# Patient Record
Sex: Male | Born: 1985 | Race: Black or African American | Hispanic: No | Marital: Single | Smoking: Former smoker
Health system: Southern US, Community
[De-identification: ages and names within clinical notes are randomized; demographics above are authoritative.]

## PROBLEM LIST (undated history)

## (undated) DIAGNOSIS — I10 Essential (primary) hypertension: Secondary | ICD-10-CM

---

## 2012-07-02 ENCOUNTER — Emergency Department (HOSPITAL_BASED_OUTPATIENT_CLINIC_OR_DEPARTMENT_OTHER): Payer: Self-pay

## 2012-07-02 ENCOUNTER — Encounter (HOSPITAL_BASED_OUTPATIENT_CLINIC_OR_DEPARTMENT_OTHER): Payer: Self-pay | Admitting: *Deleted

## 2012-07-02 ENCOUNTER — Emergency Department (HOSPITAL_BASED_OUTPATIENT_CLINIC_OR_DEPARTMENT_OTHER)
Admission: EM | Admit: 2012-07-02 | Discharge: 2012-07-02 | Disposition: A | Discharge status: Home / Self Care | Payer: Self-pay | Attending: Emergency Medicine | Admitting: Emergency Medicine

## 2012-07-02 DIAGNOSIS — R1110 Vomiting, unspecified: Secondary | ICD-10-CM | POA: Insufficient documentation

## 2012-07-02 DIAGNOSIS — J40 Bronchitis, not specified as acute or chronic: Secondary | ICD-10-CM | POA: Insufficient documentation

## 2012-07-02 DIAGNOSIS — R05 Cough: Secondary | ICD-10-CM | POA: Insufficient documentation

## 2012-07-02 DIAGNOSIS — K226 Gastro-esophageal laceration-hemorrhage syndrome: Secondary | ICD-10-CM | POA: Insufficient documentation

## 2012-07-02 DIAGNOSIS — Z79899 Other long term (current) drug therapy: Secondary | ICD-10-CM | POA: Insufficient documentation

## 2012-07-02 DIAGNOSIS — J029 Acute pharyngitis, unspecified: Secondary | ICD-10-CM | POA: Insufficient documentation

## 2012-07-02 DIAGNOSIS — R059 Cough, unspecified: Secondary | ICD-10-CM | POA: Insufficient documentation

## 2012-07-02 DIAGNOSIS — I10 Essential (primary) hypertension: Secondary | ICD-10-CM | POA: Insufficient documentation

## 2012-07-02 DIAGNOSIS — F17200 Nicotine dependence, unspecified, uncomplicated: Secondary | ICD-10-CM | POA: Insufficient documentation

## 2012-07-02 HISTORY — DX: Essential (primary) hypertension: I10

## 2012-07-02 LAB — CBC
HCT: 52.2 % — ABNORMAL HIGH (ref 39.0–52.0)
Hemoglobin: 18.1 g/dL — ABNORMAL HIGH (ref 13.0–17.0)
RDW: 14.3 % (ref 11.5–15.5)
WBC: 6.1 10*3/uL (ref 4.0–10.5)

## 2012-07-02 MED ORDER — AZITHROMYCIN 250 MG PO TABS: 250.0000 mg | ORAL_TABLET | Freq: Every day | ORAL | Status: AC

## 2012-07-02 MED ORDER — RANITIDINE HCL 150 MG PO TABS: 150.0000 mg | ORAL_TABLET | Freq: Two times a day (BID) | ORAL | Status: AC

## 2012-07-02 MED ORDER — GUAIFENESIN-CODEINE 100-10 MG/5ML PO SYRP: 5.0000 mL | ORAL_SOLUTION | Freq: Three times a day (TID) | ORAL | Status: AC | PRN

## 2012-07-02 NOTE — ED Provider Notes (Signed)
History     CSN: [id number]  Arrival date & time 07/02/12  1458   None     Chief Complaint  Patient presents with  . Hemoptysis    (Consider location/radiation/quality/duration/timing/severity/associated sxs/prior treatment) HPI Perry Morse is a 27 y.o. male who presents to the ED with cough. The cough is nonproductive. He states that he has coughed until he vomits. The vomit the past 2 days has had streaks of blood and his throat feels irritated from coughing so much. He denies fever or any other problems. The history was provided by the patient.  Past Medical History  Diagnosis Date  . Hypertension     History reviewed. No pertinent past surgical history.  History reviewed. No pertinent family history.  History  Substance Use Topics  . Smoking status: Current Every Day Smoker  . Smokeless tobacco: Not on file  . Alcohol Use: Yes      Review of Systems  Constitutional: Negative for fever and activity change.  HENT: Positive for sore throat. Negative for ear pain, trouble swallowing, neck pain and sinus pressure.   Eyes: Negative for redness.  Respiratory: Positive for cough. Negative for shortness of breath.   Gastrointestinal: Positive for vomiting (with cough). Negative for nausea and abdominal pain.  Musculoskeletal: Negative for back pain.  Skin: Negative for rash.  Neurological: Negative for headaches.  Psychiatric/Behavioral: The patient is not nervous/anxious.     Allergies  Review of patient's allergies indicates no known allergies.  Home Medications   Current Outpatient Rx  Name  Route  Sig  Dispense  Refill  . hydrochlorothiazide (HYDRODIURIL) 25 MG tablet   Oral   Take 25 mg by mouth daily.           BP 131/79  Pulse 96  Temp(Src) 98.6 F (37 C) (Oral)  Resp 20  Ht 6\' 1"  (1.854 m)  Wt 237 lb (107.502 kg)  BMI 31.27 kg/m2  SpO2 98%  Physical Exam  Nursing note and vitals reviewed. Constitutional: He is oriented to person, place,  and time. He appears well-developed and well-nourished. No distress.  HENT:  Head: Normocephalic.  Eyes: Conjunctivae and EOM are normal.  Neck: Neck supple.  Cardiovascular: Normal rate, regular rhythm and normal heart sounds.   Pulmonary/Chest: Effort normal. No respiratory distress. Wheezes: occasional. He has no rales. He exhibits tenderness.  Abdominal: Soft. There is no tenderness.  Musculoskeletal: Normal range of motion.  Neurological: He is alert and oriented to person, place, and time. No cranial nerve deficit.  Skin: Skin is warm and dry.  Psychiatric: He has a normal mood and affect. His behavior is normal.    Dg Chest 2 View  07/02/2012   *RADIOLOGY REPORT*  Clinical Data: Hemoptysis  CHEST - 2 VIEW  Comparison: None  Findings: The heart size and mediastinal contours are within normal limits.  Both lungs are clear.  The visualized skeletal structures are unremarkable.  IMPRESSION: Negative exam   Original Report Authenticated By: Signa Kell, M.D.    ED Course  Procedures (including critical care time) Results for orders placed during the hospital encounter of 07/02/12 (from the past 24 hour(s))  CBC     Status: Abnormal   Collection Time    07/02/12  5:10 PM      Result Value Range   WBC 6.1  4.0 - 10.5 K/uL   RBC 5.99 (*) 4.22 - 5.81 MIL/uL   Hemoglobin 18.1 (*) 13.0 - 17.0 g/dL   HCT 56.2 (*) 13.0 -  52.0 %   MCV 87.1  78.0 - 100.0 fL   MCH 30.2  26.0 - 34.0 pg   MCHC 34.7  30.0 - 36.0 g/dL   RDW 08.6  57.8 - 46.9 %   Platelets 197  150 - 400 K/uL     MDM  27 y.o. male with cough and vomiting due to the cough with streaks of blood. Will treat for bronchitis and Mallory Weiss tear. Patient stable for discharge to follow up with his PCP. No concern for GI bleed at this time. I have reviewed this patient's vital signs, nurses notes, appropriate labs and imaging.  I have discussed clinical, lab and x-ray findings with the patient and plan of care. Patient voices  understanding.    Medication List    TAKE these medications       azithromycin 250 MG tablet  Commonly known as:  ZITHROMAX  Take 1 tablet (250 mg total) by mouth daily. Take first 2 tablets together, then 1 every day until finished.     guaiFENesin-codeine 100-10 MG/5ML syrup  Commonly known as:  ROBITUSSIN AC  Take 5 mLs by mouth 3 (three) times daily as needed for cough.     ranitidine 150 MG tablet  Commonly known as:  ZANTAC  Take 1 tablet (150 mg total) by mouth 2 (two) times daily.      ASK your doctor about these medications       hydrochlorothiazide 25 MG tablet  Commonly known as:  HYDRODIURIL  Take 25 mg by mouth daily.               [facility] Orlene Och, [place] 07/03/12 0030

## 2012-07-02 NOTE — ED Notes (Addendum)
Pt states he has had a bad cough and some burning in the esophagus, then "throws up blood" onset yesterday. Also c/o night sweats. Got out of prison in Feb

## 2012-07-03 NOTE — ED Provider Notes (Signed)
Medical screening examination/treatment/procedure(s) were performed by non-physician practitioner and as supervising physician I was immediately available for consultation/collaboration.   Ayeshia Coppin III, MD 07/03/12 1319 

## 2017-11-16 ENCOUNTER — Emergency Department (HOSPITAL_BASED_OUTPATIENT_CLINIC_OR_DEPARTMENT_OTHER): Payer: Self-pay

## 2017-11-16 ENCOUNTER — Other Ambulatory Visit: Payer: Self-pay

## 2017-11-16 ENCOUNTER — Emergency Department (HOSPITAL_BASED_OUTPATIENT_CLINIC_OR_DEPARTMENT_OTHER)
Admission: EM | Admit: 2017-11-16 | Discharge: 2017-11-16 | Disposition: A | Discharge status: Home / Self Care | Payer: Self-pay | Attending: Emergency Medicine | Admitting: Emergency Medicine

## 2017-11-16 ENCOUNTER — Encounter (HOSPITAL_BASED_OUTPATIENT_CLINIC_OR_DEPARTMENT_OTHER): Payer: Self-pay | Admitting: *Deleted

## 2017-11-16 DIAGNOSIS — Y998 Other external cause status: Secondary | ICD-10-CM | POA: Insufficient documentation

## 2017-11-16 DIAGNOSIS — I10 Essential (primary) hypertension: Secondary | ICD-10-CM | POA: Insufficient documentation

## 2017-11-16 DIAGNOSIS — W228XXA Striking against or struck by other objects, initial encounter: Secondary | ICD-10-CM | POA: Insufficient documentation

## 2017-11-16 DIAGNOSIS — F17210 Nicotine dependence, cigarettes, uncomplicated: Secondary | ICD-10-CM | POA: Insufficient documentation

## 2017-11-16 DIAGNOSIS — Z79899 Other long term (current) drug therapy: Secondary | ICD-10-CM | POA: Insufficient documentation

## 2017-11-16 DIAGNOSIS — Y9289 Other specified places as the place of occurrence of the external cause: Secondary | ICD-10-CM | POA: Insufficient documentation

## 2017-11-16 DIAGNOSIS — S52612A Displaced fracture of left ulna styloid process, initial encounter for closed fracture: Secondary | ICD-10-CM | POA: Insufficient documentation

## 2017-11-16 DIAGNOSIS — Y9389 Activity, other specified: Secondary | ICD-10-CM | POA: Insufficient documentation

## 2017-11-16 MED ORDER — IBUPROFEN 600 MG PO TABS: 600.0000 mg | ORAL_TABLET | Freq: Four times a day (QID) | ORAL | 0 refills | Status: DC | PRN

## 2017-11-16 MED ORDER — HYDROCODONE-ACETAMINOPHEN 5-325 MG PO TABS
2.0000 | ORAL_TABLET | Freq: Once | ORAL | Status: AC
  Administered 2017-11-16: 2 via ORAL
  Filled 2017-11-16: qty 2

## 2017-11-16 MED ORDER — HYDROCODONE-ACETAMINOPHEN 5-325 MG PO TABS
1.0000 | ORAL_TABLET | Freq: Four times a day (QID) | ORAL | 0 refills | Status: AC | PRN
Start: 2017-11-16 — End: ?
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
## 2017-11-16 NOTE — ED Notes (Signed)
Pt teaching provided on medications that may cause drowsiness. Pt instructed not to drive or operate heavy machinery while taking the prescribed medication. Pt verbalized understanding.   

## 2017-11-16 NOTE — Discharge Instructions (Signed)
Take ibuprofen every 6 hours for your pain.  For breakthrough pain, you can take 1-2 Norco every 6 hours.  Use ice 3-4 times daily alternating 20 minutes on, 20 minutes off.  Please follow-up with Dr. Amanda Pea, the hand doctor, for further management of your ulnar styloid fracture.  Keep your splint on until you are evaluated by the hand doctor.  Do not get the splint wet.  Please return if you get the splint wet, develop numbness or discoloration in your fingers, or any other new or concerning symptoms.  Do not drink alcohol, drive, operate machinery or participate in any other potentially dangerous activities while taking opiate pain medication as it may make you sleepy. Do not take this medication with any other sedating medications, either prescription or over-the-counter. If you were prescribed Percocet or Vicodin, do not take these with acetaminophen (Tylenol) as it is already contained within these medications and overdose of Tylenol is dangerous.   This medication is an opiate (or narcotic) pain medication and can be habit forming.  Use it as little as possible to achieve adequate pain control.  Do not use or use it with extreme caution if you have a history of opiate abuse or dependence. This medication is intended for your use only - do not give any to anyone else and keep it in a secure place where nobody else, especially children, have access to it. It will also cause or worsen constipation, so you may want to consider taking an over-the-counter stool softener while you are taking this medication.
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
## 2017-11-18 ENCOUNTER — Other Ambulatory Visit: Payer: Self-pay

## 2017-11-18 ENCOUNTER — Emergency Department (HOSPITAL_BASED_OUTPATIENT_CLINIC_OR_DEPARTMENT_OTHER)
Admission: EM | Admit: 2017-11-18 | Discharge: 2017-11-18 | Disposition: A | Discharge status: Home / Self Care | Payer: Self-pay | Attending: Emergency Medicine | Admitting: Emergency Medicine

## 2017-11-18 ENCOUNTER — Encounter (HOSPITAL_BASED_OUTPATIENT_CLINIC_OR_DEPARTMENT_OTHER): Payer: Self-pay | Admitting: Emergency Medicine

## 2017-11-18 DIAGNOSIS — S62102D Fracture of unspecified carpal bone, left wrist, subsequent encounter for fracture with routine healing: Secondary | ICD-10-CM

## 2017-11-18 DIAGNOSIS — S6292XD Unspecified fracture of left wrist and hand, subsequent encounter for fracture with routine healing: Secondary | ICD-10-CM | POA: Insufficient documentation

## 2017-11-18 DIAGNOSIS — X58XXXD Exposure to other specified factors, subsequent encounter: Secondary | ICD-10-CM | POA: Insufficient documentation

## 2017-11-18 DIAGNOSIS — Z79899 Other long term (current) drug therapy: Secondary | ICD-10-CM | POA: Insufficient documentation

## 2017-11-18 DIAGNOSIS — I10 Essential (primary) hypertension: Secondary | ICD-10-CM | POA: Insufficient documentation

## 2017-11-18 DIAGNOSIS — F17200 Nicotine dependence, unspecified, uncomplicated: Secondary | ICD-10-CM | POA: Insufficient documentation

## 2017-11-18 MED ORDER — NAPROXEN 500 MG PO TABS: 500.0000 mg | ORAL_TABLET | Freq: Two times a day (BID) | ORAL | 0 refills | Status: AC
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
## 2017-11-18 NOTE — Discharge Instructions (Signed)
Follow-up with your primary care physician and the hand surgeon.  Take up to 1000 mg of the naproxen daily and be sure to wear your splint at all times until cleared by the surgeon.
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
## 2018-05-11 ENCOUNTER — Encounter (HOSPITAL_BASED_OUTPATIENT_CLINIC_OR_DEPARTMENT_OTHER): Payer: Self-pay

## 2018-05-11 ENCOUNTER — Other Ambulatory Visit: Payer: Self-pay

## 2018-05-11 ENCOUNTER — Emergency Department (HOSPITAL_BASED_OUTPATIENT_CLINIC_OR_DEPARTMENT_OTHER)
Admission: EM | Admit: 2018-05-11 | Discharge: 2018-05-11 | Disposition: A | Discharge status: Home / Self Care | Payer: Self-pay | Attending: Emergency Medicine | Admitting: Emergency Medicine

## 2018-05-11 DIAGNOSIS — Z5321 Procedure and treatment not carried out due to patient leaving prior to being seen by health care provider: Secondary | ICD-10-CM | POA: Insufficient documentation

## 2018-05-11 DIAGNOSIS — M7918 Myalgia, other site: Secondary | ICD-10-CM | POA: Insufficient documentation

## 2018-05-11 DIAGNOSIS — M542 Cervicalgia: Secondary | ICD-10-CM
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
## 2018-05-25 ENCOUNTER — Encounter (HOSPITAL_BASED_OUTPATIENT_CLINIC_OR_DEPARTMENT_OTHER): Payer: Self-pay | Admitting: Emergency Medicine

## 2018-05-25 ENCOUNTER — Emergency Department (HOSPITAL_BASED_OUTPATIENT_CLINIC_OR_DEPARTMENT_OTHER)
Admission: EM | Admit: 2018-05-25 | Discharge: 2018-05-25 | Disposition: A | Discharge status: Home / Self Care | Payer: Self-pay | Attending: Emergency Medicine | Admitting: Emergency Medicine

## 2018-05-25 ENCOUNTER — Other Ambulatory Visit: Payer: Self-pay

## 2018-05-25 DIAGNOSIS — M5412 Radiculopathy, cervical region: Secondary | ICD-10-CM | POA: Insufficient documentation

## 2018-05-25 DIAGNOSIS — Z87891 Personal history of nicotine dependence: Secondary | ICD-10-CM | POA: Insufficient documentation

## 2018-05-25 DIAGNOSIS — I10 Essential (primary) hypertension: Secondary | ICD-10-CM | POA: Insufficient documentation

## 2018-05-25 DIAGNOSIS — Z79899 Other long term (current) drug therapy: Secondary | ICD-10-CM | POA: Insufficient documentation

## 2018-05-25 MED ORDER — METHOCARBAMOL 750 MG PO TABS: 750.0000 mg | ORAL_TABLET | Freq: Four times a day (QID) | ORAL | 0 refills | Status: AC | PRN

## 2018-05-25 MED ORDER — PREDNISONE 20 MG PO TABS: ORAL_TABLET | ORAL | 0 refills | Status: AC
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
## 2018-05-25 NOTE — Discharge Instructions (Addendum)
1.  Start prednisone taper tonight.  You may take your first dose of muscle relaxer tonight as well. 2.  Tomorrow, to try to arrange a follow-up appointment with a family doctor.  You may go to the [facility] and wellness center as listed above.  You can fill out new patient paperwork to get established.  You may also use the referral number in the discharge instructions to help you find a family doctor who is accepting new patients.  You have been given information for the neurosurgical spine specialist, you however will probably need referral after other treatment has been tried. 3.  You may benefit from physical therapy and possibly spine injections.  These would need to be arranged through a family doctor making referrals for you.
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
## 2018-05-25 NOTE — ED Notes (Signed)
ED Provider at bedside. 

## 2018-05-25 NOTE — ED Notes (Signed)
Pt verbalizes understanding of d/c instructions and denies any further needs at this time. 

## 2018-06-02 ENCOUNTER — Other Ambulatory Visit: Payer: Self-pay | Admitting: Student

## 2018-06-02 DIAGNOSIS — M5412 Radiculopathy, cervical region: Secondary | ICD-10-CM

## 2018-06-09 ENCOUNTER — Other Ambulatory Visit: Payer: Self-pay

## 2018-06-09 ENCOUNTER — Ambulatory Visit
Admission: RE | Admit: 2018-06-09 | Discharge: 2018-06-09 | Disposition: A | Discharge status: Home / Self Care | Payer: Medicaid Other | Source: Ambulatory Visit | Attending: Student | Admitting: Student

## 2018-06-09 DIAGNOSIS — M5412 Radiculopathy, cervical region: Secondary | ICD-10-CM

## 2018-06-11 ENCOUNTER — Other Ambulatory Visit: Payer: Self-pay
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
## 2022-02-06 ENCOUNTER — Other Ambulatory Visit: Payer: Self-pay

## 2022-02-06 ENCOUNTER — Emergency Department (HOSPITAL_BASED_OUTPATIENT_CLINIC_OR_DEPARTMENT_OTHER)
Admission: EM | Admit: 2022-02-06 | Discharge: 2022-02-06 | Discharge status: Against Medical Advice | Payer: Medicaid Other | Attending: Emergency Medicine | Admitting: Emergency Medicine

## 2022-02-06 DIAGNOSIS — Z5321 Procedure and treatment not carried out due to patient leaving prior to being seen by health care provider: Secondary | ICD-10-CM | POA: Insufficient documentation

## 2022-02-06 DIAGNOSIS — U071 COVID-19: Secondary | ICD-10-CM | POA: Insufficient documentation

## 2022-02-06 DIAGNOSIS — R0981 Nasal congestion: Secondary | ICD-10-CM | POA: Diagnosis present

## 2022-02-06 LAB — RESP PANEL BY RT-PCR (RSV, FLU A&B, COVID)  RVPGX2
Influenza A by PCR: NEGATIVE
Influenza B by PCR: NEGATIVE
Resp Syncytial Virus by PCR: NEGATIVE
SARS Coronavirus 2 by RT PCR: POSITIVE — AB

## 2022-02-06 NOTE — ED Triage Notes (Signed)
Patient presents to ED via POV from home. Here with nasal congestion, generalized body aches, chills and fatigue x 2 days.
